# Patient Record
Sex: Female | Born: 1990 | Race: Black or African American | Hispanic: No | Marital: Single | Smoking: Current every day smoker
Health system: Southern US, Community
[De-identification: ages and names within clinical notes are randomized; demographics above are authoritative.]

---
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
## 2019-07-09 ENCOUNTER — Other Ambulatory Visit: Payer: Self-pay

## 2019-07-09 ENCOUNTER — Emergency Department (HOSPITAL_BASED_OUTPATIENT_CLINIC_OR_DEPARTMENT_OTHER)
Admission: EM | Admit: 2019-07-09 | Discharge: 2019-07-09 | Disposition: A | Discharge status: Home / Self Care | Payer: Medicaid Other | Attending: Emergency Medicine | Admitting: Emergency Medicine

## 2019-07-09 ENCOUNTER — Encounter (HOSPITAL_BASED_OUTPATIENT_CLINIC_OR_DEPARTMENT_OTHER): Payer: Self-pay | Admitting: *Deleted

## 2019-07-09 DIAGNOSIS — N760 Acute vaginitis: Secondary | ICD-10-CM | POA: Insufficient documentation

## 2019-07-09 DIAGNOSIS — N939 Abnormal uterine and vaginal bleeding, unspecified: Secondary | ICD-10-CM | POA: Diagnosis present

## 2019-07-09 DIAGNOSIS — B9689 Other specified bacterial agents as the cause of diseases classified elsewhere: Secondary | ICD-10-CM

## 2019-07-09 DIAGNOSIS — F17210 Nicotine dependence, cigarettes, uncomplicated: Secondary | ICD-10-CM | POA: Diagnosis not present

## 2019-07-09 LAB — URINALYSIS, MICROSCOPIC (REFLEX)

## 2019-07-09 LAB — CBC WITH DIFFERENTIAL/PLATELET
Abs Immature Granulocytes: 0.01 10*3/uL (ref 0.00–0.07)
Basophils Absolute: 0 10*3/uL (ref 0.0–0.1)
Basophils Relative: 1 %
Eosinophils Absolute: 0.1 10*3/uL (ref 0.0–0.5)
Eosinophils Relative: 2 %
HCT: 39.3 % (ref 36.0–46.0)
Hemoglobin: 12.4 g/dL (ref 12.0–15.0)
Immature Granulocytes: 0 %
Lymphocytes Relative: 36 %
Lymphs Abs: 2.3 10*3/uL (ref 0.7–4.0)
MCH: 24.4 pg — ABNORMAL LOW (ref 26.0–34.0)
MCHC: 31.6 g/dL (ref 30.0–36.0)
MCV: 77.2 fL — ABNORMAL LOW (ref 80.0–100.0)
Monocytes Absolute: 0.3 10*3/uL (ref 0.1–1.0)
Monocytes Relative: 5 %
Neutro Abs: 3.6 10*3/uL (ref 1.7–7.7)
Neutrophils Relative %: 56 %
Platelets: 220 10*3/uL (ref 150–400)
RBC: 5.09 MIL/uL (ref 3.87–5.11)
RDW: 13.7 % (ref 11.5–15.5)
WBC: 6.4 10*3/uL (ref 4.0–10.5)
nRBC: 0 % (ref 0.0–0.2)

## 2019-07-09 LAB — URINALYSIS, ROUTINE W REFLEX MICROSCOPIC
Bilirubin Urine: NEGATIVE
Glucose, UA: NEGATIVE mg/dL
Ketones, ur: NEGATIVE mg/dL
Leukocytes,Ua: NEGATIVE
Nitrite: NEGATIVE
Protein, ur: NEGATIVE mg/dL
Specific Gravity, Urine: <1.005 — ABNORMAL LOW (ref 1.005–1.030)
pH: 6.5 (ref 5.0–8.0)

## 2019-07-09 LAB — WET PREP, GENITAL
Sperm: NONE SEEN
Trich, Wet Prep: NONE SEEN
Yeast Wet Prep HPF POC: NONE SEEN

## 2019-07-09 LAB — PREGNANCY, URINE: Preg Test, Ur: NEGATIVE

## 2019-07-09 MED ORDER — METRONIDAZOLE 500 MG PO TABS: 500.0000 mg | ORAL_TABLET | Freq: Two times a day (BID) | ORAL | 0 refills | Status: AC

## 2019-07-09 MED ORDER — ACETAMINOPHEN 325 MG PO TABS
650.0000 mg | ORAL_TABLET | Freq: Once | ORAL | Status: AC
  Administered 2019-07-09: 650 mg via ORAL
  Filled 2019-07-09: qty 2

## 2019-07-09 NOTE — ED Notes (Signed)
C/o abd pain and vag bleeding x 2 weeks  w some blood clots states bright, red, and brown colored blood

## 2019-07-09 NOTE — ED Triage Notes (Signed)
Pt c/o lower abd pain with vaginal bleeding  X 2 weeks after taking out IUD

## 2019-07-09 NOTE — Discharge Instructions (Addendum)
Follow-up with your OB/GYN.  Return here as needed for any worsening symptoms.

## 2019-07-09 NOTE — ED Provider Notes (Signed)
[facility] EMERGENCY DEPARTMENT Provider Note   CSN: [id number] Arrival date & time: 07/09/19  1440    History   Chief Complaint Chief Complaint  Patient presents with  . Vaginal Bleeding    HPI Kaylee Wiley is a 28 y.o. female.     Patient is a 28 year old female who presents with vaginal bleeding.  She states that she had her IUD removed about 2 weeks ago by her OB/GYN.  She states she has had vaginal bleeding since that time.  At times it is heavy but right now it is fairly late.  She has had some dark brown-colored blood clots at times.  She has some lower abdominal cramping.  She has a little bit of dizziness and fatigue.  No fevers.  No vomiting.  She has had some clear discharge but no other discharge.     History reviewed. No pertinent past medical history.  There are no active problems to display for this patient.   History reviewed. No pertinent surgical history.   OB History   No obstetric history on file.      Home Medications    Prior to Admission medications   Medication Sig Start Date End Date Taking? Authorizing Provider  metroNIDAZOLE (FLAGYL) 500 MG tablet Take 1 tablet (500 mg total) by mouth 2 (two) times daily. One po bid x 7 days 07/09/19   Rolan BuccoBelfi, Jonny Longino, MD    Family History History reviewed. No pertinent family history.  Social History Social History   Tobacco Use  . Smoking status: Current Every Day Smoker    Packs/day: 0.50    Types: Cigarettes  . Smokeless tobacco: Never Used  Substance Use Topics  . Alcohol use: Not Currently  . Drug use: Not on file     Allergies   Patient has no known allergies.   Review of Systems Review of Systems  Constitutional: Negative for chills, diaphoresis, fatigue and fever.  HENT: Negative for congestion, rhinorrhea and sneezing.   Eyes: Negative.   Respiratory: Negative for cough, chest tightness and shortness of breath.   Cardiovascular: Negative for chest pain and leg  swelling.  Gastrointestinal: Positive for abdominal pain. Negative for blood in stool, diarrhea, nausea and vomiting.  Genitourinary: Positive for vaginal bleeding and vaginal discharge. Negative for difficulty urinating, flank pain, frequency and hematuria.  Musculoskeletal: Negative for arthralgias and back pain.  Skin: Negative for rash.  Neurological: Negative for dizziness, speech difficulty, weakness, numbness and headaches.     Physical Exam Updated Vital Signs BP (!) 172/112   Pulse 67   Temp 98.3 F (36.8 C) (Oral)   Resp 18   Ht 5\' 6"  (1.676 m)   Wt 74.4 kg   LMP 06/22/2019   SpO2 100%   BMI 26.47 kg/m   Physical Exam Constitutional:      Appearance: She is well-developed.  HENT:     Head: Normocephalic and atraumatic.  Eyes:     Pupils: Pupils are equal, round, and reactive to light.  Neck:     Musculoskeletal: Normal range of motion and neck supple.  Cardiovascular:     Rate and Rhythm: Normal rate and regular rhythm.     Heart sounds: Normal heart sounds.  Pulmonary:     Effort: Pulmonary effort is normal. No respiratory distress.     Breath sounds: Normal breath sounds. No wheezing or rales.  Chest:     Chest wall: No tenderness.  Abdominal:     General: Bowel sounds are  normal.     Palpations: Abdomen is soft.     Tenderness: There is abdominal tenderness. There is no guarding or rebound.     Comments: Mild suprapubic tenderness on exam  Genitourinary:    Comments: Vaginal exam shows a small amount of dark blood in the vault.  No cervical motion tenderness, no adnexal tenderness Musculoskeletal: Normal range of motion.  Lymphadenopathy:     Cervical: No cervical adenopathy.  Skin:    General: Skin is warm and dry.     Findings: No rash.  Neurological:     Mental Status: She is alert and oriented to person, place, and time.      ED Treatments / Results  Labs (all labs ordered are listed, but only abnormal results are displayed) Labs Reviewed   WET PREP, GENITAL - Abnormal; Notable for the following components:      Result Value   Clue Cells Wet Prep HPF POC PRESENT (*)    WBC, Wet Prep HPF POC FEW (*)    All other components within normal limits  URINALYSIS, ROUTINE W REFLEX MICROSCOPIC - Abnormal; Notable for the following components:   Specific Gravity, Urine <1.005 (*)    Hgb urine dipstick LARGE (*)    All other components within normal limits  URINALYSIS, MICROSCOPIC (REFLEX) - Abnormal; Notable for the following components:   Bacteria, UA FEW (*)    All other components within normal limits  CBC WITH DIFFERENTIAL/PLATELET - Abnormal; Notable for the following components:   MCV 77.2 (*)    MCH 24.4 (*)    All other components within normal limits  PREGNANCY, URINE  RPR  HIV ANTIBODY (ROUTINE TESTING W REFLEX)  GC/CHLAMYDIA PROBE AMP ([place]) NOT AT [facility]    EKG None  Radiology No results found.  Procedures Procedures (including critical care time)  Medications Ordered in ED Medications - No data to display   Initial Impression / Assessment and Plan / ED Course  I have reviewed the triage vital signs and the nursing notes.  Pertinent labs & imaging results that were available during my care of the patient were reviewed by me and considered in my medical decision making (see chart for details).        Patient here with vaginal bleeding after her IUD was removed.  She does not currently have any heavy bleeding.  Her pregnancy test is negative.  Her hemoglobin is okay.  She does have evidence of BV which I will treat due to her cramping although I think her cramping is more likely due to the bleeding.  I encouraged her to follow-up with her OB/GYN.  Return precautions were given.  She will need to have her blood pressure rechecked at her OB/GYN as well.  I notified her of this.  Final Clinical Impressions(s) / ED Diagnoses   Final diagnoses:  Abnormal uterine bleeding (AUB)  Bacterial vaginosis     ED Discharge Orders         Ordered    metroNIDAZOLE (FLAGYL) 500 MG tablet  2 times daily     07/09/19 1644           Malvin Johns, MD 07/09/19 1645

## 2019-07-09 NOTE — ED Notes (Signed)
ED Provider at bedside. 

## 2019-07-10 LAB — RPR: RPR Ser Ql: NONREACTIVE

## 2019-07-10 LAB — HIV ANTIBODY (ROUTINE TESTING W REFLEX): HIV Screen 4th Generation wRfx: NONREACTIVE

## 2019-07-13 LAB — GC/CHLAMYDIA PROBE AMP (~~LOC~~) NOT AT ARMC
Chlamydia: NEGATIVE
Neisseria Gonorrhea: NEGATIVE

## 2019-09-07 ENCOUNTER — Other Ambulatory Visit: Payer: Self-pay

## 2019-09-07 ENCOUNTER — Emergency Department (HOSPITAL_BASED_OUTPATIENT_CLINIC_OR_DEPARTMENT_OTHER)
Admission: EM | Admit: 2019-09-07 | Discharge: 2019-09-07 | Discharge status: Against Medical Advice | Payer: Medicaid Other | Attending: Emergency Medicine | Admitting: Emergency Medicine

## 2019-09-07 ENCOUNTER — Encounter (HOSPITAL_BASED_OUTPATIENT_CLINIC_OR_DEPARTMENT_OTHER): Payer: Self-pay

## 2019-09-07 DIAGNOSIS — R1084 Generalized abdominal pain: Secondary | ICD-10-CM | POA: Diagnosis present

## 2019-09-07 DIAGNOSIS — Z5321 Procedure and treatment not carried out due to patient leaving prior to being seen by health care provider: Secondary | ICD-10-CM | POA: Diagnosis not present

## 2019-09-07 NOTE — ED Triage Notes (Addendum)
Pt c/o HA x 3-4 days-also c/o abd cramping started last night after ending LMP-NAD-steady gait-pt states she gets covid test weekly at work-neg covid on 9/21-did not have covid test yesterday
